# Patient Record
Sex: Male | Born: 1935 | Hispanic: Refuse to answer | Marital: Single
Health system: Southern US, Community
[De-identification: ages and names within clinical notes are randomized; demographics above are authoritative.]

---

## 2010-05-31 ENCOUNTER — Emergency Department: Payer: Self-pay | Admitting: Emergency Medicine

## 2010-06-02 ENCOUNTER — Ambulatory Visit: Payer: Self-pay | Admitting: Internal Medicine

## 2010-06-07 ENCOUNTER — Ambulatory Visit: Payer: Self-pay | Admitting: Internal Medicine

## 2010-06-07 ENCOUNTER — Ambulatory Visit: Payer: Self-pay | Admitting: General Practice

## 2010-06-18 ENCOUNTER — Ambulatory Visit: Payer: Self-pay | Admitting: Cardiology

## 2010-06-18 ENCOUNTER — Ambulatory Visit: Payer: Self-pay | Admitting: Urology

## 2010-07-03 ENCOUNTER — Ambulatory Visit: Payer: Self-pay | Admitting: Internal Medicine

## 2011-08-18 IMAGING — CT CT ABDOMEN AND PELVIS WITHOUT AND WITH CONTRAST
2 of 5 series · 12 of 32 positions shown, 17 images · non-contrast
Comparison: none

REASON FOR EXAM: GROSS HEMATURIA
COMMENTS:

[Series 2: wo · axial · 0.79mm/px · z∈[-926,-576]mm · 6 of 99 slices shown]
[im 15/99  soft-tissue]
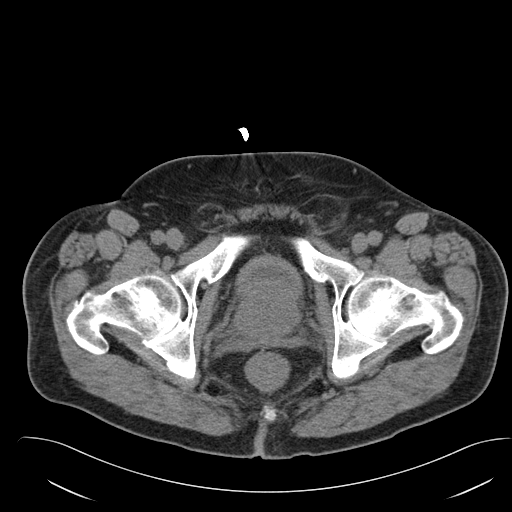
[im 29/99  soft-tissue]
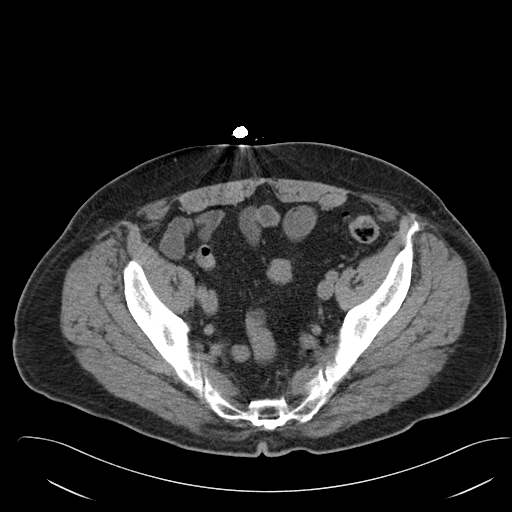
[im 43/99  soft-tissue]
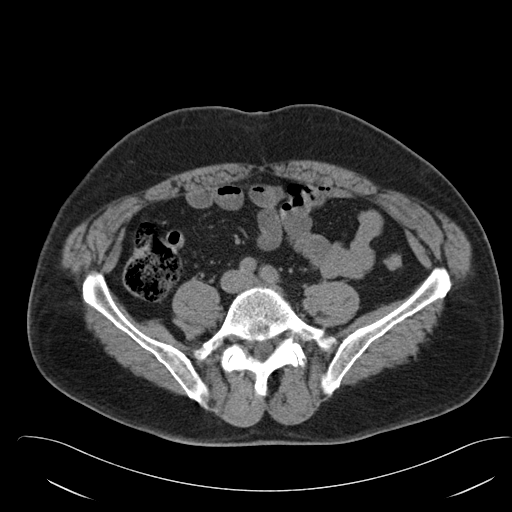
[im 57/99  soft-tissue]
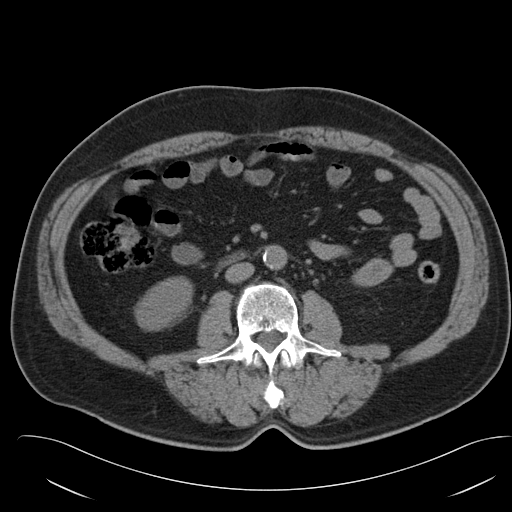
[im 71/99  soft-tissue]
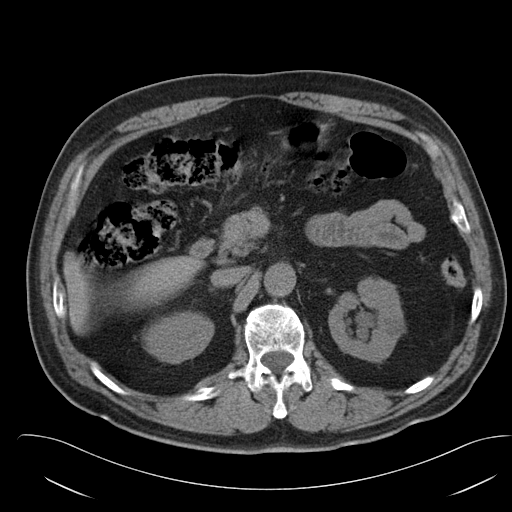
[im 85/99  soft-tissue]
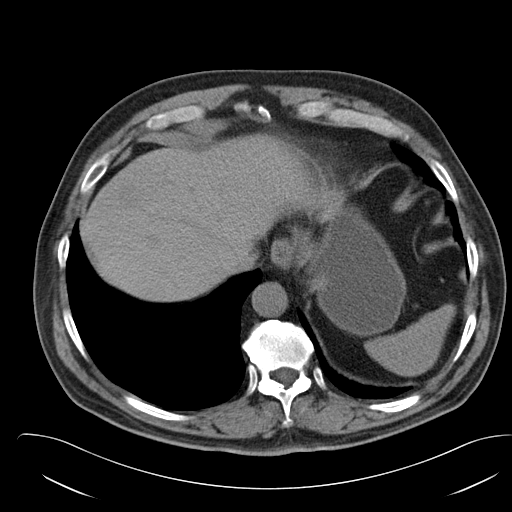

[Series 10: delay · axial · delayed · 0.79mm/px · z∈[-1028,-654]mm · 6 of 106 slices shown, 11 images]
[im 16/106  soft-tissue]
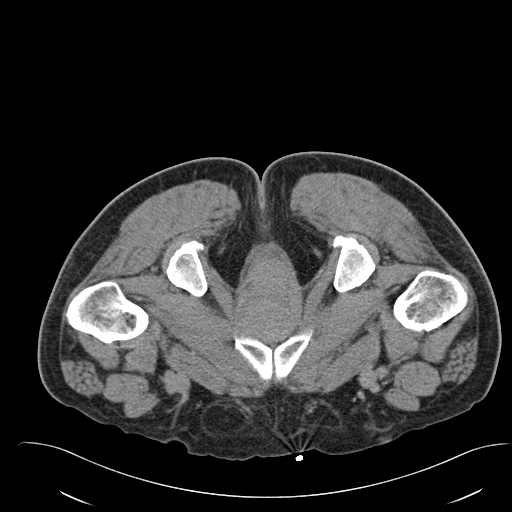
[im 16/106  bone]
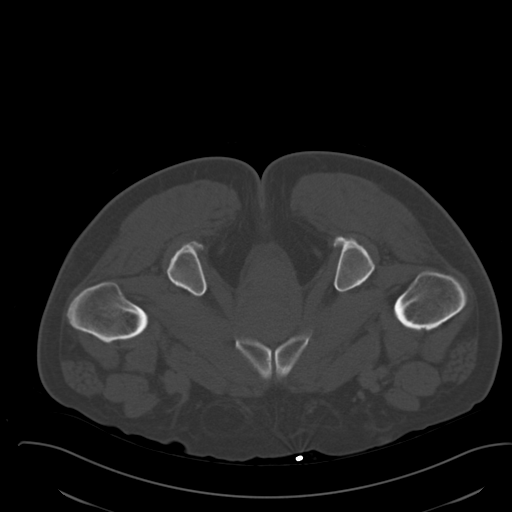
[im 31/106  soft-tissue]
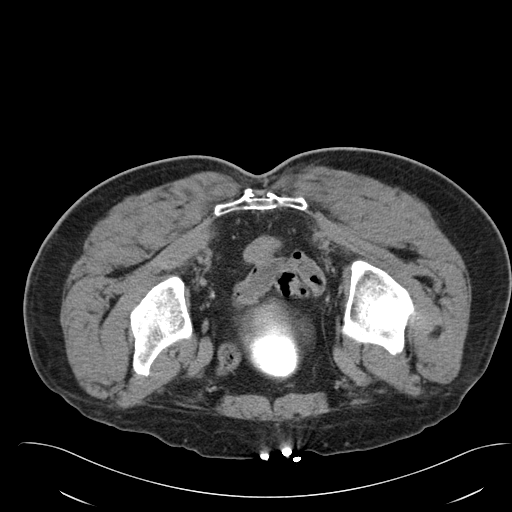
[im 46/106  soft-tissue]
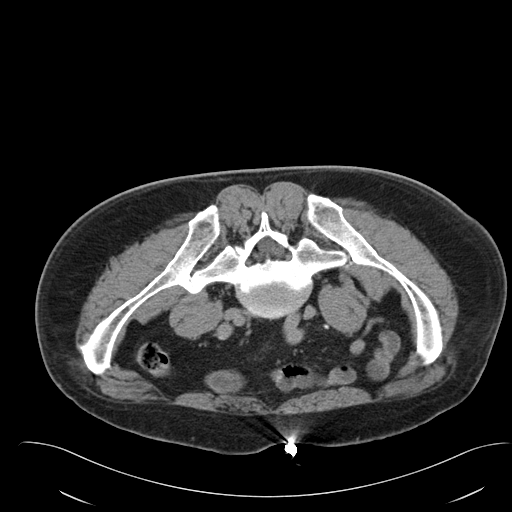
[im 46/106  lung]
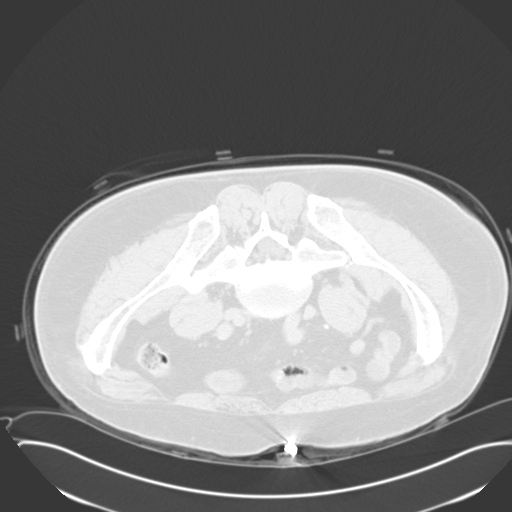
[im 61/106  soft-tissue]
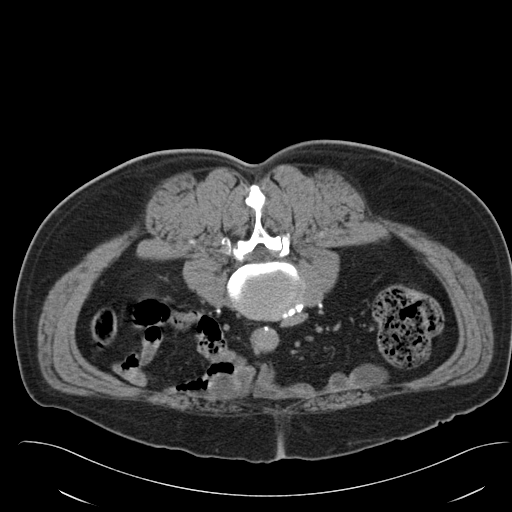
[im 61/106  lung]
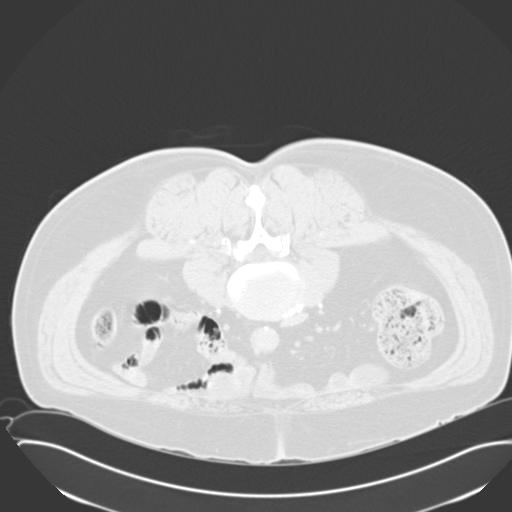
[im 76/106  soft-tissue]
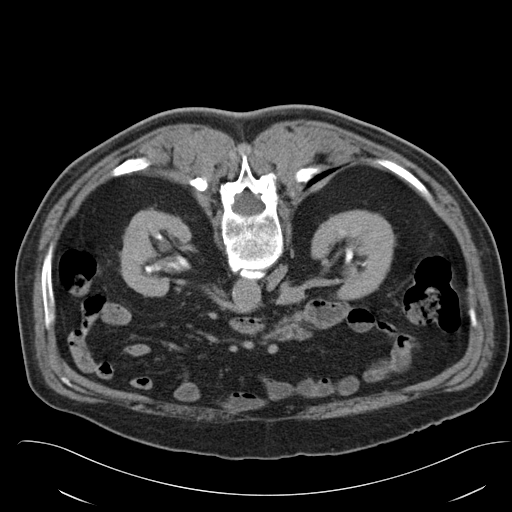
[im 76/106  lung]
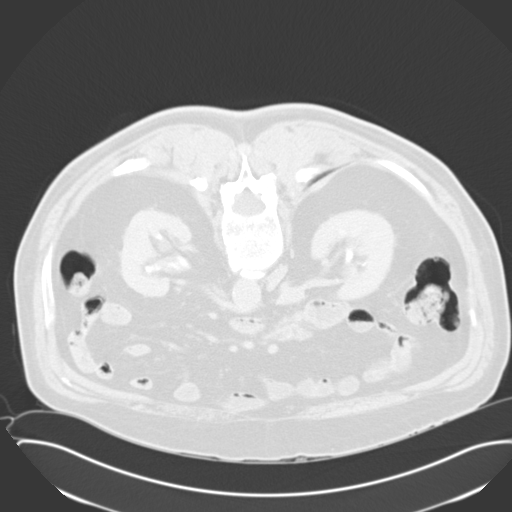
[im 91/106  soft-tissue]
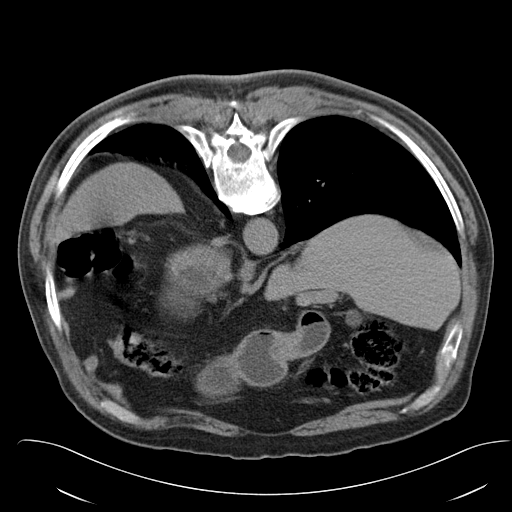
[im 91/106  lung]
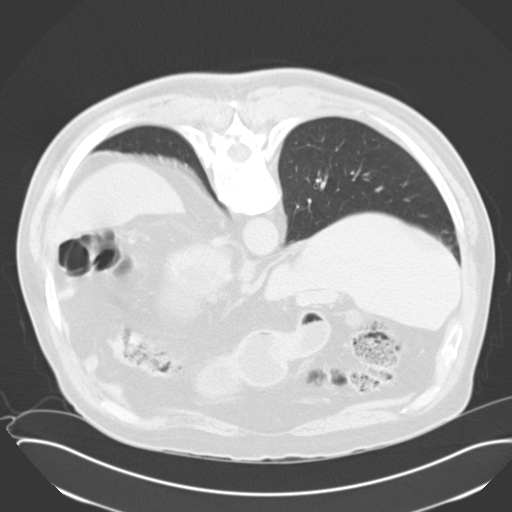

[12 of 32 positions shown; findings below may reference images not displayed]

PROCEDURE:     CT  - CT ABDOMEN / PELVIS  W/WO  - June 07, 2010  [DATE]

RESULT:     A triphasic CT scan was performed through the abdomen and pelvis
at 5 mm intervals and slice thicknesses. Review of multiplanar reconstructed
images was performed separately on the VIA monitor. The postcontrast images
the patient received 100 cc of Isovue

There is increased soft tissue density within the inferior aspect renal
pelvis worrisome for a soft tissue mass. This could reflect transitional
cell malignancy but certainly infection or hematoma could result in this
finding. However, I see no perinephric inflammatory change. The right kidney
enhances well with no evidence of obstruction or parenchymal masses.

In the midpole of the left kidney there is a 2 cm diameter hypodensity with
Hounsfield measurement of -1 precontrast, +9 immediately postcontrast, and
+8 on delayed images. There is a tiny subcentimeter adjacent hypodensity as
well. In the lower pole of the left kidney there is a 1 cm diameter
hypodensity that is exophytic with Hounsfield measurement of positive 7
precontrast, +13 immediately postcontrast, and +8 on delayed images. This is
most compatible with a cyst.

On delayed images there is fractional visualization of the ureters. There
remains soft tissue density material in the renal pelvis on the left. The
urinary bladder is partially distended. There is prominent irregular
impression upon the urinary bladder base on the adjacent enlarged prostate
gland.

The liver, gallbladder, partially distended stomach, pancreas, spleen, and
adrenal glands are normal in appearance. The caliber of the abdominal aorta
is normal. The unopacified loops of small and large bowel exhibit no acute
abnormality. There are bilateral fat containing inguinal hernias. The lung
bases are clear. The lumbar vertebral bodies are preserved in height.
IMPRESSION: 1. There is abnormal soft tissue density within the inferior aspect of the
renal pelvis and the lower pole calyces on the left worrisome for malignancy
or other pathologic process. There are simple appearing cystic structures in
the left kidney is described. The right kidney is normal in appearance. I
see no calcified stones.
2. The prostate gland is enlarged and produces a prominent irregular
impression upon the urinary bladder base.
3. I see no acute hepatobiliary abnormality. There is no acute bowel
abnormality.

## 2013-06-21 LAB — COMPREHENSIVE METABOLIC PANEL
Anion Gap: 6 — ABNORMAL LOW (ref 7–16)
Bilirubin,Total: 0.4 mg/dL (ref 0.2–1.0)
Chloride: 106 mmol/L (ref 98–107)
Co2: 22 mmol/L (ref 21–32)
Creatinine: 4.23 mg/dL — ABNORMAL HIGH (ref 0.60–1.30)
Potassium: 5.8 mmol/L — ABNORMAL HIGH (ref 3.5–5.1)
SGOT(AST): 13 U/L — ABNORMAL LOW (ref 15–37)
Sodium: 134 mmol/L — ABNORMAL LOW (ref 136–145)

## 2013-06-21 LAB — URINALYSIS, COMPLETE
Glucose,UR: NEGATIVE mg/dL (ref 0–75)
Nitrite: NEGATIVE
Protein: 100
Specific Gravity: 1.009 (ref 1.003–1.030)
Squamous Epithelial: <1
WBC UR: 384 /HPF (ref 0–5)

## 2013-06-21 LAB — CBC
HCT: 23.6 % — ABNORMAL LOW (ref 40.0–52.0)
MCH: 28.6 pg (ref 26.0–34.0)
Platelet: 154 10*3/uL (ref 150–440)
RBC: 2.78 10*6/uL — ABNORMAL LOW (ref 4.40–5.90)
RDW: 16.5 % — ABNORMAL HIGH (ref 11.5–14.5)
WBC: 18.5 10*3/uL — ABNORMAL HIGH (ref 3.8–10.6)

## 2013-06-22 ENCOUNTER — Ambulatory Visit: Payer: Self-pay | Admitting: Internal Medicine

## 2013-06-22 ENCOUNTER — Inpatient Hospital Stay: Payer: Self-pay | Admitting: Specialist

## 2013-06-22 LAB — CBC WITH DIFFERENTIAL/PLATELET
Basophil #: 0 10*3/uL (ref 0.0–0.1)
Eosinophil #: 0.1 10*3/uL (ref 0.0–0.7)
Eosinophil %: 0.9 %
HGB: 7.2 g/dL — ABNORMAL LOW (ref 13.0–18.0)
Lymphocyte #: 1 10*3/uL (ref 1.0–3.6)
Lymphocyte %: 5.9 %
MCH: 28.6 pg (ref 26.0–34.0)
MCHC: 33.3 g/dL (ref 32.0–36.0)
MCV: 86 fL (ref 80–100)
Monocyte #: 1 x10 3/mm (ref 0.2–1.0)
Monocyte %: 6.3 %
Neutrophil #: 14.4 10*3/uL — ABNORMAL HIGH (ref 1.4–6.5)
Neutrophil %: 86.7 %
RBC: 2.51 10*6/uL — ABNORMAL LOW (ref 4.40–5.90)
RDW: 16.5 % — ABNORMAL HIGH (ref 11.5–14.5)
WBC: 16.6 10*3/uL — ABNORMAL HIGH (ref 3.8–10.6)

## 2013-06-22 LAB — APTT: Activated PTT: 44 secs — ABNORMAL HIGH (ref 23.6–35.9)

## 2013-06-22 LAB — BASIC METABOLIC PANEL
BUN: 81 mg/dL — ABNORMAL HIGH (ref 7–18)
Chloride: 113 mmol/L — ABNORMAL HIGH (ref 98–107)
Co2: 23 mmol/L (ref 21–32)
Creatinine: 4.33 mg/dL — ABNORMAL HIGH (ref 0.60–1.30)
EGFR (Non-African Amer.): 12 — ABNORMAL LOW
Sodium: 141 mmol/L (ref 136–145)

## 2013-06-22 LAB — PROTIME-INR: Prothrombin Time: 15.7 secs — ABNORMAL HIGH (ref 11.5–14.7)

## 2013-06-23 LAB — BASIC METABOLIC PANEL
Co2: 21 mmol/L (ref 21–32)
EGFR (African American): 15 — ABNORMAL LOW
EGFR (Non-African Amer.): 13 — ABNORMAL LOW
Glucose: 129 mg/dL — ABNORMAL HIGH (ref 65–99)
Osmolality: 309 (ref 275–301)
Potassium: 4.8 mmol/L (ref 3.5–5.1)

## 2013-06-23 LAB — CBC WITH DIFFERENTIAL/PLATELET
Basophil #: 0 10*3/uL (ref 0.0–0.1)
Basophil %: 0.3 %
HCT: 20.4 % — ABNORMAL LOW (ref 40.0–52.0)
HGB: 6.9 g/dL — ABNORMAL LOW (ref 13.0–18.0)
Lymphocyte #: 1 10*3/uL (ref 1.0–3.6)
Lymphocyte %: 8.6 %
MCH: 28.9 pg (ref 26.0–34.0)
MCV: 85 fL (ref 80–100)
Monocyte %: 7.3 %
Neutrophil #: 9.4 10*3/uL — ABNORMAL HIGH (ref 1.4–6.5)
Neutrophil %: 81.8 %
RBC: 2.39 10*6/uL — ABNORMAL LOW (ref 4.40–5.90)
WBC: 11.6 10*3/uL — ABNORMAL HIGH (ref 3.8–10.6)

## 2013-06-24 LAB — CBC WITH DIFFERENTIAL/PLATELET
Basophil #: 0 10*3/uL (ref 0.0–0.1)
Basophil %: 0.4 %
Eosinophil #: 0.1 10*3/uL (ref 0.0–0.7)
Eosinophil %: 0.7 %
HCT: 23.2 % — ABNORMAL LOW (ref 40.0–52.0)
Lymphocyte %: 9.1 %
MCH: 29.2 pg (ref 26.0–34.0)
MCV: 84 fL (ref 80–100)
Monocyte #: 0.7 x10 3/mm (ref 0.2–1.0)
Monocyte %: 8.2 %
Neutrophil %: 81.6 %
WBC: 8.4 10*3/uL (ref 3.8–10.6)

## 2013-06-24 LAB — BASIC METABOLIC PANEL
Anion Gap: 6 — ABNORMAL LOW (ref 7–16)
BUN: 67 mg/dL — ABNORMAL HIGH (ref 7–18)
Co2: 23 mmol/L (ref 21–32)
EGFR (African American): 16 — ABNORMAL LOW
EGFR (Non-African Amer.): 13 — ABNORMAL LOW

## 2013-06-25 LAB — HEMOGLOBIN: HGB: 8 g/dL — ABNORMAL LOW (ref 13.0–18.0)

## 2013-06-26 LAB — BASIC METABOLIC PANEL
Anion Gap: 8 (ref 7–16)
Calcium, Total: 8.6 mg/dL (ref 8.5–10.1)
Chloride: 110 mmol/L — ABNORMAL HIGH (ref 98–107)
Creatinine: 3.04 mg/dL — ABNORMAL HIGH (ref 0.60–1.30)
EGFR (African American): 22 — ABNORMAL LOW
EGFR (Non-African Amer.): 19 — ABNORMAL LOW
Osmolality: 299 (ref 275–301)
Sodium: 140 mmol/L (ref 136–145)

## 2013-06-26 LAB — CBC WITH DIFFERENTIAL/PLATELET
Basophil %: 0.7 %
Eosinophil #: 0.4 10*3/uL (ref 0.0–0.7)
Eosinophil %: 5.8 %
HGB: 8 g/dL — ABNORMAL LOW (ref 13.0–18.0)
Lymphocyte #: 0.9 10*3/uL — ABNORMAL LOW (ref 1.0–3.6)
Lymphocyte %: 12.7 %
MCH: 28.1 pg (ref 26.0–34.0)
MCV: 85 fL (ref 80–100)
Monocyte %: 8.9 %
Neutrophil %: 71.9 %
Platelet: 141 10*3/uL — ABNORMAL LOW (ref 150–440)
RBC: 2.85 10*6/uL — ABNORMAL LOW (ref 4.40–5.90)
RDW: 15.4 % — ABNORMAL HIGH (ref 11.5–14.5)

## 2013-06-26 LAB — PROTEIN / CREATININE RATIO, URINE
Creatinine, Urine: 60.5 mg/dL (ref 30.0–125.0)
Protein/Creat. Ratio: 3306 mg/gCREAT — ABNORMAL HIGH (ref 0–200)

## 2013-06-26 LAB — CULTURE, BLOOD (SINGLE)

## 2013-06-26 LAB — URIC ACID: Uric Acid: 4.8 mg/dL (ref 3.5–7.2)

## 2013-06-27 LAB — BASIC METABOLIC PANEL
Anion Gap: 5 — ABNORMAL LOW (ref 7–16)
BUN: 62 mg/dL — ABNORMAL HIGH (ref 7–18)
Calcium, Total: 8.5 mg/dL (ref 8.5–10.1)
Chloride: 110 mmol/L — ABNORMAL HIGH (ref 98–107)
Co2: 24 mmol/L (ref 21–32)
EGFR (African American): 22 — ABNORMAL LOW
EGFR (Non-African Amer.): 19 — ABNORMAL LOW
Glucose: 107 mg/dL — ABNORMAL HIGH (ref 65–99)
Osmolality: 296 (ref 275–301)
Potassium: 4.3 mmol/L (ref 3.5–5.1)
Sodium: 139 mmol/L (ref 136–145)

## 2013-07-03 ENCOUNTER — Ambulatory Visit: Payer: Self-pay | Admitting: Internal Medicine

## 2014-12-23 NOTE — Consult Note (Signed)
Brief Consult Note: Diagnosis: Right basicervical/IT fracture.   Patient was seen by consultant.   Comments: Patient had a syncopal episode and fell at home.  Has a history of stage 4 renal cell carcinoma with diffuse metastatic disease.  Apparently has not been getting treatment for several months. Patient's family states that they did not appreciate his level of illness.  WBC 18000.  Chronic urosepsis.  Right hip shortened.  Postured in external rotation.  Currently not responsive to my commands.  Moves foot to tactile stimulation.  Images: Right hip basicervical/IT fracture.  No soft tissue mass surrounding fracture. Possible lytic lesion in femoral neck.   Assessment: Peritroch fracture of the right hip.    Plan: Recommend patient is seen by palliative care and that discussion with family and oncology is had.  If patient is medically cleared and not septic, and family/patient would like to proceed with surgery, will plan for biopsy and IM nail of fracture. Will await word from medical team and palliative care.  Electronic Signatures: Murlean Harkamasunder, Andrej Spagnoli (MD)  (Signed 20-Oct-14 23:17)  Authored: Brief Consult Note   Last Updated: 20-Oct-14 23:17 by Murlean Harkamasunder, Ninel Abdella (MD)

## 2014-12-23 NOTE — Consult Note (Signed)
   Comments   I had a lengthy meeting with pt's son and 3 of his daughters with assistance from interpreter. Family says that pt was doing well prior to his hip fx and was independent in all of his ADL's. He stopped chemo 2 months ago and he and his wife went to [place] during that 2 month span. Pt returned 1 wk ago for an appt with his MD. Wife is still in [place].  says that pt believes that his cancer is cured. He has been told that his cancer is not curable but pt believes doctors can make mistakes. It is not clear to me if family shares pt's belief. Family says that pt would accpt any tx offered him including further chemo if recommended by his MD.  discussed code status with family. They understand and agree with full code for pt at this time. I suggested that they discuss with pt when he is able and they agreed with this. Pt's wife is his decision-maker in the event he cannot make decisions for himself. As wife is in [place], I suggested children decide who might be pt's decision-maker. They say it will be a group decision with daughter Harriett Sineancy having the final say.     Electronic Signatures: Alayla Dethlefs, Harriett SineNancy (MD)  (Signed 21-Oct-14 12:04)  Authored: Palliative Care   Last Updated: 21-Oct-14 12:04 by Davonne Jarnigan, Harriett SineNancy (MD)

## 2014-12-23 NOTE — Discharge Summary (Signed)
PATIENT NAME:  Joseph Bonilla, Joseph Bonilla MR#:  [id number] DATE OF BIRTH:  06/28/1932  DATE OF ADMISSION:  06/22/2013 DATE OF DISCHARGE:  06/27/2013  For a detailed note, please see the history and physical done on admission by Dr. Heron Nay.   DIAGNOSES AT DISCHARGE: Is as follows: 1. Mechanical fall with a right hip fracture status post open reduction internal fixation.  2. Metastatic renal cell carcinoma.  3. Chronic kidney disease, stage III.  4. Diabetes.  5. Adult failure to thrive.  6.  Extended-Spectrum Beta-lactamase urinary tract infection.  7. Hypertension.   DIET: The patient is being discharged on a low sodium diet.   ACTIVITY: As tolerated.   FOLLOW-UP: Dr. Juliann Pares next 1 to 2 weeks.   DISCHARGE MEDICATIONS: Are as follows: Gabapentin 300 mg b.i.d., promethazine 25 mg q.6 hours as needed, Flomax 0.4 mg daily, oxybutynin 5 mg t.i.d., with oxycodone 10/325 1 tab q.6 hours as needed, metoprolol tartrate 50 mg b.i.d., ertapenem 1 gram intramuscular injection x 8 days 0.5 gm. Dronabinol 5 mg b.i.d.   Consultants During The Hospital Course: Dr. Juliann Pares from a nephrology. Dr. Mosetta Pigeon from nephrology. Dr. Harriett Sine Phifer palliative care. Dr. Murlean Hark from orthopedics.   PERTINENT STUDIES DONE DURING THE HOSPITAL COURSE: Are as follows: A CT of the head done on  admission showing no evidence of acute ischemic or hemorrhagic infarction. No intracranial mass effect. A CT of the right hip showing sustained fracture of the base of the neck of the right femur. An ultrasound of the kidneys showing bilateral hydronephrosis, mild to moderate bilateral renal cysts. No further evidence of sonographic abnormalities.   HOSPITAL COURSE: This is a 81 year old male with medical problems as mentioned above, presented to the hospital after he had a fall and noted to have a right hip fracture.  1. Right hip fracture. This was likely secondary to mechanical fall. Although when the patient  presented to the hospital he was noted to be in acute renal failure, hyperkalemic and therefore need to be medically optimized prior to going to the operating room. The patient's potassium was corrected. His renal function was improved with some gentle IV fluids. The patient was therefore taken to the operating room on 10/27 and underwent open reduction and internal fixation of the right hip. The patient presently is postoperative day four hip surgery. He is tolerating physical therapy well and is being discharged to a skilled nursing facility for ongoing rehab. The patient will continue pain control with some p.r.n. Norco.  2. Acute on chronic renal failure. The patient's baseline creatinine is around 1.7 on admission. The patient presented to the hospital with creatinine as high as 4.5. The patient was gently hydrated with IV fluids. His creatinine since then has improved and plateaued at around 3.0. He is making good urine. His urine ultrasound showed bilateral hydronephrosis. He presented with chronic Foley catheter which he will continue, along with his Flomax as stated. The patient did not require any hemodialysis in the hospital and we will continue follow-up with Dr. Juliann Pares as an outpatient from the nephrology standpoint.  3. Hyperkalemia. The patient received some IV fluids and Kayexalate and his potassium since then has corrected.  4. Sepsis. The patient was noted to have a low-grade fevers and hypotension, had an abnormal urinalysis. Initially, was on ceftriaxone for urinary tract infection, but the urine culture came back to be positive for ESBL. He was switched over from IV ceftriaxone to IV ertapenem. He has received two doses of  IV ertapenem here. He is being discharged on 8 more days of IM ertapenem to finish treatment for his ESBL UTIs.  5. Renal cell carcinoma. The patient follows up with his oncologist in [place]. He apparently has received some chemotherapy in the past. He has stage IV  disease. His prognosis is likely fairly poor, as per the family, the patient was supposed to have re-imaging done after his last chemotherapy session. The patient will continue follow-up with his oncologist upon discharge from the rehab facility.  6. Acute on chronic anemia. The patient's hemoglobin has remained stable while in the hospital,  between 7 and 8. He did require 1 unit of packed red blood cells while he was in the hospital.  7. CODE STATUS: The patient is a FULL CODE.   He is being discharged to a skilled nursing facility for ongoing rehab, status post his hip fracture.   TIME SPENT ON DISCHARGE: 40 minutes.    ____________________________ Rolly PancakeVivek J. Cherlynn KaiserSainani, MD vjs:sg D: 06/27/2013 13:14:59 ET T: 06/27/2013 13:53:48 ET JOB#: [id number]  cc: Rolly PancakeVivek J. Cherlynn KaiserSainani, MD, <Dictator> Dwayne D. Juliann Paresallwood, MD     Houston SirenVIVEK J SAINANI MD ELECTRONICALLY SIGNED 06/30/2013 14:33

## 2014-12-23 NOTE — Op Note (Signed)
PATIENT NAME:  Joseph Bonilla, Joseph Bonilla MR#:  [id number] DATE OF BIRTH:  Mar 30, 1932  DATE OF PROCEDURE:  06/23/2013  PREOPERATIVE DIAGNOSIS: Intertrochanteric fracture, right hip.   POSTOPERATIVE DIAGNOSIS: Intertrochanteric fracture, right hip.   PROCEDURE PERFORMED: 1.  Bone biopsy right hip fracture.  2.  Intramedullary nail fixation of right intertrochanteric hip fracture.   SURGEON: Murlean HarkShalini Delmar Arriaga M.D.   ASSISTANT: None.   ANESTHESIA: Spinal anesthesia with sedation.   ESTIMATED BLOOD LOSS: 100 mL.  SPECIMEN: Initial biopsy specimen as well as all reamings were sent for pathologic analysis.   FINDINGS: Initial pathology stated that no definitive malignancy was noted.   DISPOSITION: The patient will be weight-bearing as tolerated postoperatively. We will await final pathology results. He will be placed on blood thinner medication. He will be seen by physical therapy.   INDICATIONS FOR PROCEDURE: Mr. Joseph Bonilla is a 81 year old gentleman with a known history of renal cell carcinoma. He had a syncopal episode and sustained a fracture of the right intertrochanteric region. He was brought to the Emergency Room where his blood pressure was quite well and he was found to have urosepsis. After 24 hours of negative blood cultures and return of his white count from 18 to 11 from antibiotics he was taken to the operating room for aforementioned procedure. The right hip was marked preoperatively.   DESCRIPTION OF PROCEDURE: Mr. Joseph Bonilla was brought into the operating room. He was placed on the fracture table in the supine position. Spinal anesthesia had previously been administered. The patient was placed in a traction set-up with the contralateral leg in a well-leg holder. He was adequately secured on the bed with adequate padding.   Surgical timeout was performed. Under fluoroscopic guidance, the tip of the greater trochanter was identified. A small longitudinal incision was made just  proximal to the tip of the greater trochanter. Sharp dissection was carried down through the underlying fascia. Blunt dissection was carried down through the muscle. The tip of the greater trochanter was readily palpated and the fracture line was readily palpated. A core biopsy needle was inserted directly through the fracture site and adequate specimen of bone was obtained. Touch preparation of bone was done and no evidence of malignancy was noted.   Under fluoroscopic guidance, starting pin for intramedullary nail was inserted. Confirmation on orthogonal imaging was done. Soft tissue protector guide was inserted and opening reamer was used to open the proximal cortex. A ball-tipped guidewire with a slight bend was inserted and taken down to the level of the distal metaphyseal flare. Guidewire was measured. Sequential reaming was carried out starting at 9 mm and proceeding to 13 mm. All reamings were sent for pathologic analysis.   At this time, a 130 degree 11 mm x 400 mm hip fracture nail was inserted. Placement of the nail was confirmed on orthogonal radiographs at both the hip and the knee. Ball-tipped guidewire was removed. Using the provided lag screw drill sleeve, drill sleeve was deployed. A mark was made on the skin and a longitudinal incision was made. Sharp dissection was carried down through the fascia and blunt dissection was carried down through the muscle to bone. The sleeve was placed directly on the bone. Under orthogonal fluoroscopic guidance, pin was placed into the femoral neck and head. Confirmation of placement was done with fluoroscopic imaging. This was measured and overdrilled to 110 mm. A 10.5 mm x 110 mm hip fracture nail lag screw was inserted. Care was taken to ensure that the fracture  was stable throughout insertion. At this time, the pin was removed. Compression was carried out and watched under live fluoroscopy. Compression device was removed. Nail was now locked proximally and  loosened by a quarter turn.   At this time, attention was turned to the distal femur. Using a standard perfect circle technique, a static distal interlocking screw 5 mm x 44 mm was inserted.   All wounds were copiously irrigated. Fascia was closed using 0 Vicryl suture. Subcutaneous tissue was closed using 2-0 Vicryl suture. Skin was closed using staples. Sterile dressings were applied. The patient was taken off the operating room table and placed on his hospital bed.   He will be weight-bearing as tolerated. He will be placed on Lovenox for DVT prophylaxis. He will have physical therapy. We will await final pathologic results. ____________________________ Murlean Hark, MD sr:sb D: 06/25/2013 15:51:15 ET T: 06/25/2013 17:20:06 ET JOB#: [id number]  cc: Murlean Hark, MD, <Dictator> Murlean Hark MD ELECTRONICALLY SIGNED 07/08/2013 12:40

## 2014-12-23 NOTE — H&P (Signed)
PATIENT NAME:  Joseph Bonilla, Joseph Bonilla MR#:  [id number] DATE OF BIRTH:  1931/12/21  DATE OF ADMISSION:  06/22/2013  PRIMARY CARE PHYSICIAN: Dr. Benita Gutter  REFERRING PHYSICIAN:  Debbora Dus  CHIEF COMPLAINT:  Fall, right hip pain.   HISTORY OF PRESENT ILLNESS: This patient is a 81 year old Hispanic male, is brought to the Emergency Department after having a fall at 4 in the morning The patient was trying to on a switch, lost balance, and fell down. The patient's family heard the sound of his fall. The patient was given 2 pills of Percocet prior to coming to the Emergency Department. The patient was unresponsive. Considering this, the patient was given one dose of Narcan. Work-up in the Emergency Department showed the patient has right femoral neck fracture. CT of the pelvis did not show in any pathologic fracture. Orthopedic surgery was consulted. Seen by Dr. Claris Gladden.   Considering the patient's metastatic disease, recommended palliative care; however, if the patient's family insists that patient will undergo biopsy and pinning of the hip. Considering about the patient's the altered mental status, CT head was obtained which was negative other than chronic ischemic changes. After the patient was given Narcan, the patient's blood pressure dropped to 70s. The patient was given fluids.  A right internal jugular central line was placed by the Emergency Department physician. Also, work-up showed the patient had strong urinary tract infection. The patient was given Rocephin. There was concern about septic shock, caused him to have low blood pressure. The patient was also found to have elevated white blood cell count of 18.5, BUN 86, creatinine of 4.23. We do not have the patient's baseline creatinine. The patient is also found to have a potassium of 5.8. Initial set of cardiac enzymes are less than 0.02.   PAST MEDICAL HISTORY:  1.  Renal cell carcinoma; underwent chemotherapy at [facility] 2.  Hypertension.   3.  Diabetes mellitus.  4.  History of Parkinson's disease.  5.  Benign prostatic hypertrophy.   PAST SURGICAL HISTORY: The patient's daughter could not tell.   ALLERGIES: NO KNOWN DRUG ALLERGIES.   HOME MEDICATIONS:  1.  Tamsulosin 0.4 mg once a day.  2.  Phenergan 25 mg every six hours as needed.  3.  Gabapentin 300 mg 2 times a day.  4.  Lasix 20 mg once a day.  5.  Percocet every six hours as needed.   SOCIAL HISTORY: Smoked remotely. Denies drinking alcohol or using illicit drugs. Currently lives with his daughter.   FAMILY HISTORY: Diabetes mellitus and hypertension.   REVIEW OF SYSTEMS: The patient is moaning, does not speak [place].  The history is mainly obtained from the patient's daughter.   PHYSICAL EXAMINATION:  GENERAL:  This is a well-built, well-nourished, age-appropriate male, in moderate to severe distress secondary to pain.  VITAL SIGNS: Temperature is 102.1, pulse 87, blood pressure 139/52, respiratory rate of 16, oxygen saturation 100% on room air.  HEENT: Head normocephalic, atraumatic. There is no sclerae icterus. Conjunctivae normal. Pupils equal and react to light. Mucous membranes, mild dryness. No pharyngeal erythema.  NECK: Supple. No lymphadenopathy. No JVD, no carotid bruit. CHEST: No focal tenderness. Lungs bilaterally clear to auscultation.  HEART: S1, S2, regular, tachycardia.  ABDOMEN: Bowel sounds present. Has tenderness. No guarding. Could not examine the hepatosplenomegaly.  EXTREMITIES:  Right lower extremity externally rotated.  SKIN: No rash or lesions.  MUSCULOSKELETAL: Decreased range of motion in the right leg.   LABORATORY DATA: Chest x-ray, PA and  lateral, no acute cardiopulmonary disease.   Urinalysis 3+ leukocyte esterase, WBC of 384.   CBC: WBC of 18.5, hemoglobin 10, platelet count 154.   Complete metabolic panel: BUN 86, creatinine of 4.3, potassium of 5.8. Troponin less than 0.02.  X-ray of the pelvis: Acute fracture of the  base of the neck of the right femur.   CT head without contrast: No acute intracranial abnormality.   CT of the hip shows sustained fracture of the base of the neck of the right femur, with extension into the intertrochanteric region.  No findings to suggest this is a pathologic fracture.  No evidence of fracture in the visualized portions of the right hemipelvis.  EKG, 12 lead, shows normal sinus rhythm with no ST-T wave abnormalities.  ASSESSMENT AND PLAN: The patient is a 81 year old male who is brought to the Emergency Department after having a fall in the morning.  1.  Right femoral neck fracture. The patient has been seen by orthopedic surgery. Plan to do the pinning as well as biopsy of the fracture site. The patient, per family, has not had any cardiac work-up done. No recent history of any heart catheterization. No previous work-up. No previous stress test. Previous history of smoking. Considering the patient's age, hypertension, diabetes mellitus, no cardiac work-up, the patient is at moderate to severe risk for this surgery. Discussed with the family.  The patient's family states that they would like to proceed with the surgery.  2.  Acute renal failure. We do not know the patient's baseline. He has elevated BUN and creatinine. Continue with IV fluids and follow up.  3.  Hyperkalemia. Will give Kayexalate. No EKG changes.  4.  Sepsis, secondary to urinary tract infection.  Will keep the patient on vancomycin and Zosyn, follow up with the blood and urine cultures.  5.  Altered mental status secondary to pain medication; however, responded after giving the Narcan.  6.  Renal cell carcinoma.  Per patient's daughter, plan to rescan him next month and, if needed, the patient will undergo chemotherapy. 7.  Diabetes mellitus, currently not on any medications.  Keep the patient on sliding scale insulin.  8.  Keep the patient on deep vein thrombosis prophylaxis with Lovenox.   TIME SPENT: 55  minutes.  ____________________________ Susa GriffinsPadmaja Evren Shankland, MD pv:cg D: 06/22/2013 01:31:26 ET T: 06/22/2013 02:27:31 ET JOB#: [id number]  cc: Susa GriffinsPadmaja Tamer Baughman, MD, <Dictator> Knute Neuobert G. Lorre NickGittin, MD Susa GriffinsPADMAJA Neelie Welshans MD ELECTRONICALLY SIGNED 07/03/2013 3:18

## 2015-02-01 DEATH — deceased
